# Patient Record
Sex: Female | Born: 1980 | Race: White | Hispanic: No | Marital: Single | Smoking: Never smoker
Health system: Southern US, Community
[De-identification: ages and names within clinical notes are randomized; demographics above are authoritative.]

## PROBLEM LIST (undated history)

## (undated) DIAGNOSIS — N200 Calculus of kidney: Secondary | ICD-10-CM

## (undated) HISTORY — PX: WRIST SURGERY: SHX841

---

## 2000-01-29 ENCOUNTER — Emergency Department (HOSPITAL_COMMUNITY): Admission: EM | Admit: 2000-01-29 | Discharge: 2000-01-29 | Payer: Self-pay | Admitting: *Deleted

## 2000-01-29 ENCOUNTER — Encounter: Payer: Self-pay | Admitting: *Deleted

## 2001-12-25 ENCOUNTER — Encounter: Payer: Self-pay | Admitting: Urology

## 2001-12-25 ENCOUNTER — Ambulatory Visit (HOSPITAL_BASED_OUTPATIENT_CLINIC_OR_DEPARTMENT_OTHER): Admission: RE | Admit: 2001-12-25 | Discharge: 2001-12-25 | Payer: Self-pay | Admitting: Urology

## 2002-02-10 ENCOUNTER — Other Ambulatory Visit: Admission: RE | Admit: 2002-02-10 | Discharge: 2002-02-10 | Payer: Self-pay | Admitting: Obstetrics and Gynecology

## 2003-03-19 ENCOUNTER — Other Ambulatory Visit: Admission: RE | Admit: 2003-03-19 | Discharge: 2003-03-19 | Payer: Self-pay | Admitting: Obstetrics and Gynecology

## 2003-03-23 ENCOUNTER — Other Ambulatory Visit: Admission: RE | Admit: 2003-03-23 | Discharge: 2003-03-23 | Payer: Self-pay | Admitting: Obstetrics and Gynecology

## 2004-05-03 ENCOUNTER — Other Ambulatory Visit: Admission: RE | Admit: 2004-05-03 | Discharge: 2004-05-03 | Payer: Self-pay | Admitting: Obstetrics and Gynecology

## 2004-09-20 ENCOUNTER — Emergency Department (HOSPITAL_COMMUNITY): Admission: EM | Admit: 2004-09-20 | Discharge: 2004-09-21 | Payer: Self-pay | Admitting: Emergency Medicine

## 2004-10-09 ENCOUNTER — Ambulatory Visit (HOSPITAL_COMMUNITY): Admission: RE | Admit: 2004-10-09 | Discharge: 2004-10-10 | Payer: Self-pay | Admitting: Orthopedic Surgery

## 2005-06-03 ENCOUNTER — Other Ambulatory Visit: Admission: RE | Admit: 2005-06-03 | Discharge: 2005-06-03 | Payer: Self-pay | Admitting: Obstetrics and Gynecology

## 2005-12-04 ENCOUNTER — Other Ambulatory Visit: Admission: RE | Admit: 2005-12-04 | Discharge: 2005-12-04 | Payer: Self-pay | Admitting: Obstetrics and Gynecology

## 2013-03-21 ENCOUNTER — Emergency Department (HOSPITAL_COMMUNITY)
Admission: EM | Admit: 2013-03-21 | Discharge: 2013-03-21 | Disposition: A | Discharge status: Home / Self Care | Attending: Emergency Medicine | Admitting: Emergency Medicine

## 2013-03-21 ENCOUNTER — Emergency Department (HOSPITAL_COMMUNITY)

## 2013-03-21 ENCOUNTER — Encounter (HOSPITAL_COMMUNITY): Payer: Self-pay | Admitting: *Deleted

## 2013-03-21 DIAGNOSIS — S99919A Unspecified injury of unspecified ankle, initial encounter: Secondary | ICD-10-CM | POA: Insufficient documentation

## 2013-03-21 DIAGNOSIS — W108XXA Fall (on) (from) other stairs and steps, initial encounter: Secondary | ICD-10-CM | POA: Insufficient documentation

## 2013-03-21 DIAGNOSIS — Y9301 Activity, walking, marching and hiking: Secondary | ICD-10-CM | POA: Insufficient documentation

## 2013-03-21 DIAGNOSIS — S99921A Unspecified injury of right foot, initial encounter: Secondary | ICD-10-CM

## 2013-03-21 DIAGNOSIS — Z79899 Other long term (current) drug therapy: Secondary | ICD-10-CM | POA: Insufficient documentation

## 2013-03-21 DIAGNOSIS — S8990XA Unspecified injury of unspecified lower leg, initial encounter: Secondary | ICD-10-CM | POA: Insufficient documentation

## 2013-03-21 DIAGNOSIS — Y9289 Other specified places as the place of occurrence of the external cause: Secondary | ICD-10-CM | POA: Insufficient documentation

## 2013-03-21 DIAGNOSIS — Z87442 Personal history of urinary calculi: Secondary | ICD-10-CM | POA: Insufficient documentation

## 2013-03-21 HISTORY — DX: Calculus of kidney: N20.0

## 2013-03-21 MED ORDER — HYDROCODONE-ACETAMINOPHEN 5-325 MG PO TABS
1.0000 | ORAL_TABLET | Freq: Once | ORAL | Status: AC
  Administered 2013-03-21: 1 via ORAL
  Filled 2013-03-21: qty 1

## 2013-03-21 MED ORDER — HYDROCODONE-ACETAMINOPHEN 5-325 MG PO TABS: 1.0000 | ORAL_TABLET | ORAL | Status: DC | PRN

## 2013-03-21 NOTE — Progress Notes (Signed)
Orthopedic Tech Progress Note Patient Details:  Julia Rosales [birth year] [id number]  Ortho Devices Type of Ortho Device: Crutches;CAM walker Ortho Device/Splint Interventions: Application   Cammer, Mickie Bail 03/21/2013, 12:38 PM

## 2013-03-21 NOTE — ED Notes (Signed)
Pt dc'd home w/all belongings, alert and ambulatory w/crutches upon dc, pt gave a return demonstration with crutches, pt verbalizes understanding of dc instructions, 1 new rx prescribed

## 2013-03-21 NOTE — ED Notes (Signed)
Ortho tech called for assistance with crutches and cam walker

## 2013-03-21 NOTE — ED Provider Notes (Signed)
History     CSN: [id number]  Arrival date & time 03/21/13  1033   First MD Initiated Contact with Patient 03/21/13 1037      Chief Complaint  Patient presents with  . Foot Injury    (Consider location/radiation/quality/duration/timing/severity/associated sxs/prior treatment) Patient is a 32 y.o. female presenting with foot injury. The history is provided by the patient.  Foot Injury Location:  Foot Foot location:  R foot Pain details:    Severity:  Moderate Associated symptoms: no fever and no neck pain   Associated symptoms comment:  She fell last night while walking down stairs. Injury to right foot with persistent swelling and discoloration today. Painful ambulation. NO other injury.   Past Medical History  Diagnosis Date  . Kidney stones     Past Surgical History  Procedure Laterality Date  . Wrist surgery      History reviewed. No pertinent family history.  History  Substance Use Topics  . Smoking status: Not on file  . Smokeless tobacco: Not on file  . Alcohol Use: Not on file    OB History   Grav Para Term Preterm Abortions TAB SAB Ect Mult Living                  Review of Systems  Constitutional: Negative for fever and chills.  HENT: Negative for neck pain.   Gastrointestinal: Negative.  Negative for abdominal pain.  Musculoskeletal:       See HPI.  Skin: Negative.   Neurological: Negative.  Negative for numbness.    Allergies  Review of patient's allergies indicates no known allergies.  Home Medications   Current Outpatient Rx  Name  Route  Sig  Dispense  Refill  . BIOTIN PO   Oral   Take 1 tablet by mouth daily.         Marland Kitchen ibuprofen (ADVIL,MOTRIN) 200 MG tablet   Oral   Take 600 mg by mouth every 6 (six) hours as needed for pain.         Marland Kitchen levonorgestrel-ethinyl estradiol (JOLESSA) 0.15-0.03 MG tablet   Oral   Take 1 tablet by mouth daily.         . Multiple Vitamin (MULTIVITAMIN) tablet   Oral   Take 1 tablet by mouth  daily.         Marland Kitchen oxyCODONE-acetaminophen (PERCOCET) 10-325 MG per tablet   Oral   Take 1 tablet by mouth every 4 (four) hours as needed for pain.           BP 127/77  Pulse 78  Temp(Src) 98.9 F (37.2 C) (Oral)  Resp 16  SpO2 100%  Physical Exam  Constitutional: She is oriented to person, place, and time. She appears well-developed and well-nourished.  Neck: Normal range of motion.  Cardiovascular: Intact distal pulses.   Pulmonary/Chest: Effort normal.  Musculoskeletal:  Right foot with marked swelling to dorsal, anterolateral mid-foot. Moderate ecchymosis. Ankle is non-tender to palpation and joint is stable. FROM all digits.   Neurological: She is alert and oriented to person, place, and time.  Skin: Skin is warm and dry.    ED Course  Procedures (including critical care time)  Labs Reviewed - No data to display Dg Foot Complete Right  03/21/2013  *RADIOLOGY REPORT*  Clinical Data: Fall down stairs, metatarsal pain  RIGHT FOOT COMPLETE - 3+ VIEW  Comparison: None.  Findings: On the oblique view, there is a nondisplaced fracture of the anterior calcaneus along the articular surface.  Also, on the lateral view, there is an acute minimally-displaced fracture of the navicular bone dorsally.  Mild soft tissue swelling in this region. No significant malalignment of the hind foot.  No other fractures demonstrated by plain radiography.  IMPRESSION: Small acute avulsion type fractures of the anterior calcaneus articular surface and the dorsal navicular bone.   Original Report Authenticated By: Judie Petit. Miles Costain, M.D.      No diagnosis found.  1. Right foot injury.  MDM  Avulsion fractures to calcaneus and navicular bones. CAM walker applied with crutches. Ortho follow up.        Arnoldo Hooker, PA-C 03/21/13 1254

## 2013-03-21 NOTE — ED Provider Notes (Signed)
Medical screening examination/treatment/procedure(s) were performed by non-physician practitioner and as supervising physician I was immediately available for consultation/collaboration.   Cythina Mickelsen B. Bernette Mayers, MD 03/21/13 1258

## 2013-03-21 NOTE — ED Notes (Signed)
To ED for eval of right foot injury and deformity since falling down a cple stairs last pm. Good cms

## 2013-12-07 IMAGING — CR DG FOOT COMPLETE 3+V*R*
3 series · 3 of 3 positions shown · non-contrast
Comparison: None.

CLINICAL DATA: Fall down stairs, metatarsal pain

RIGHT FOOT COMPLETE - 3+ VIEW

[t foot ap right]
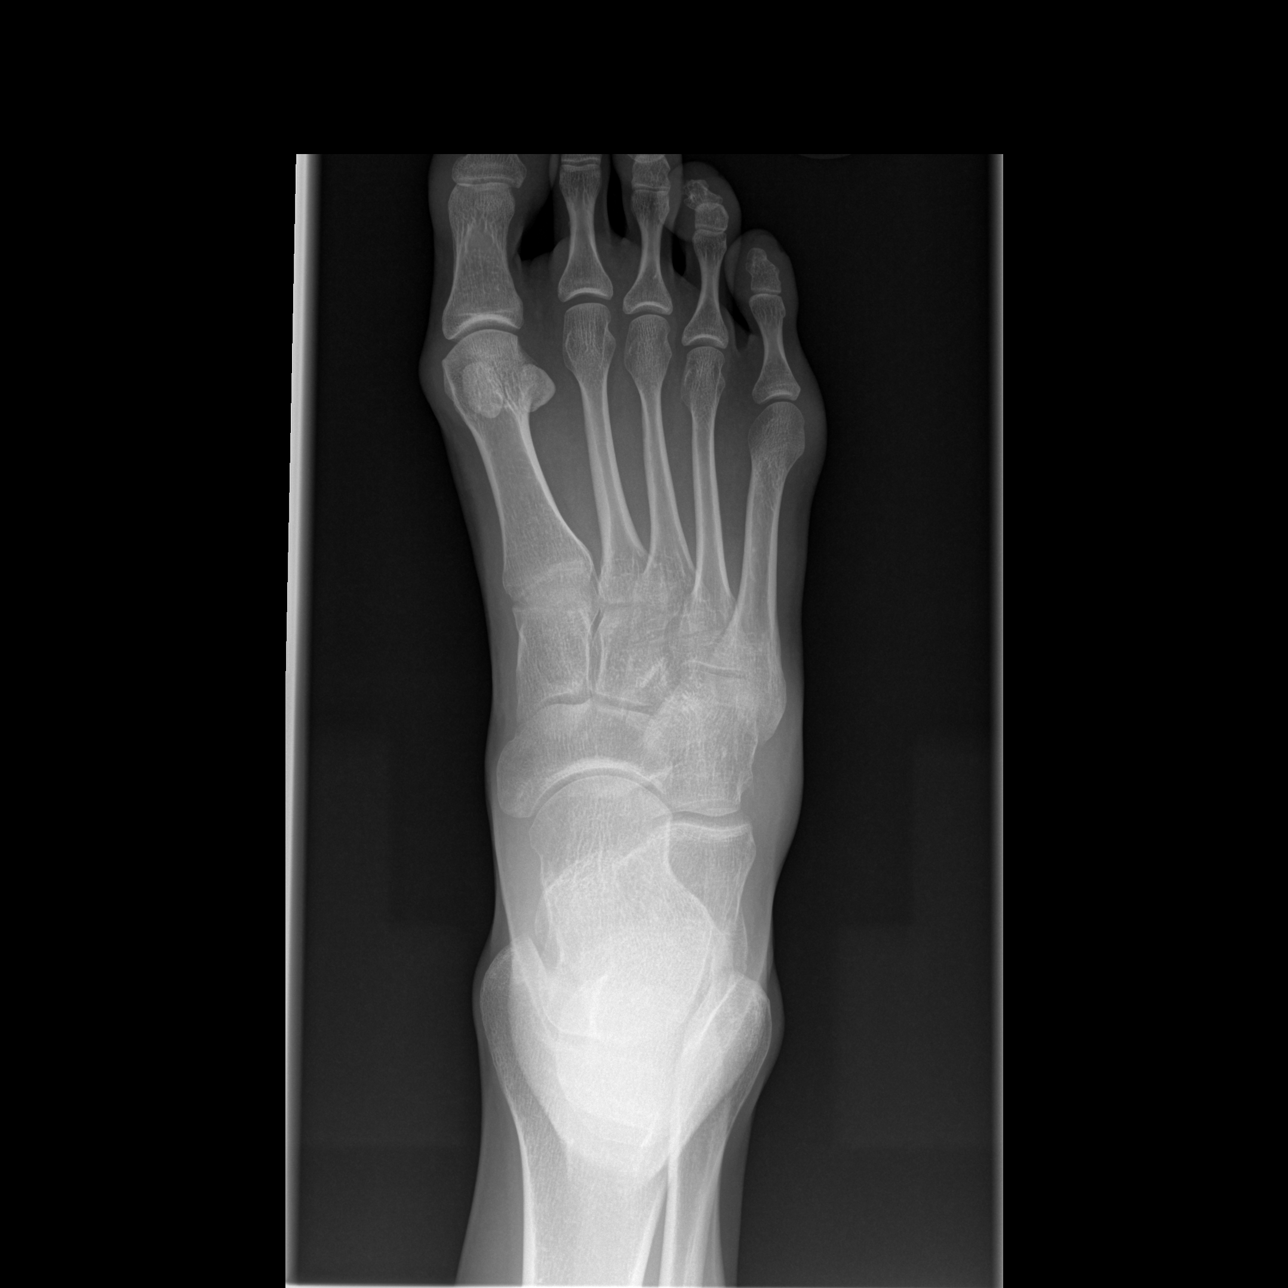

[t foot oblique right]
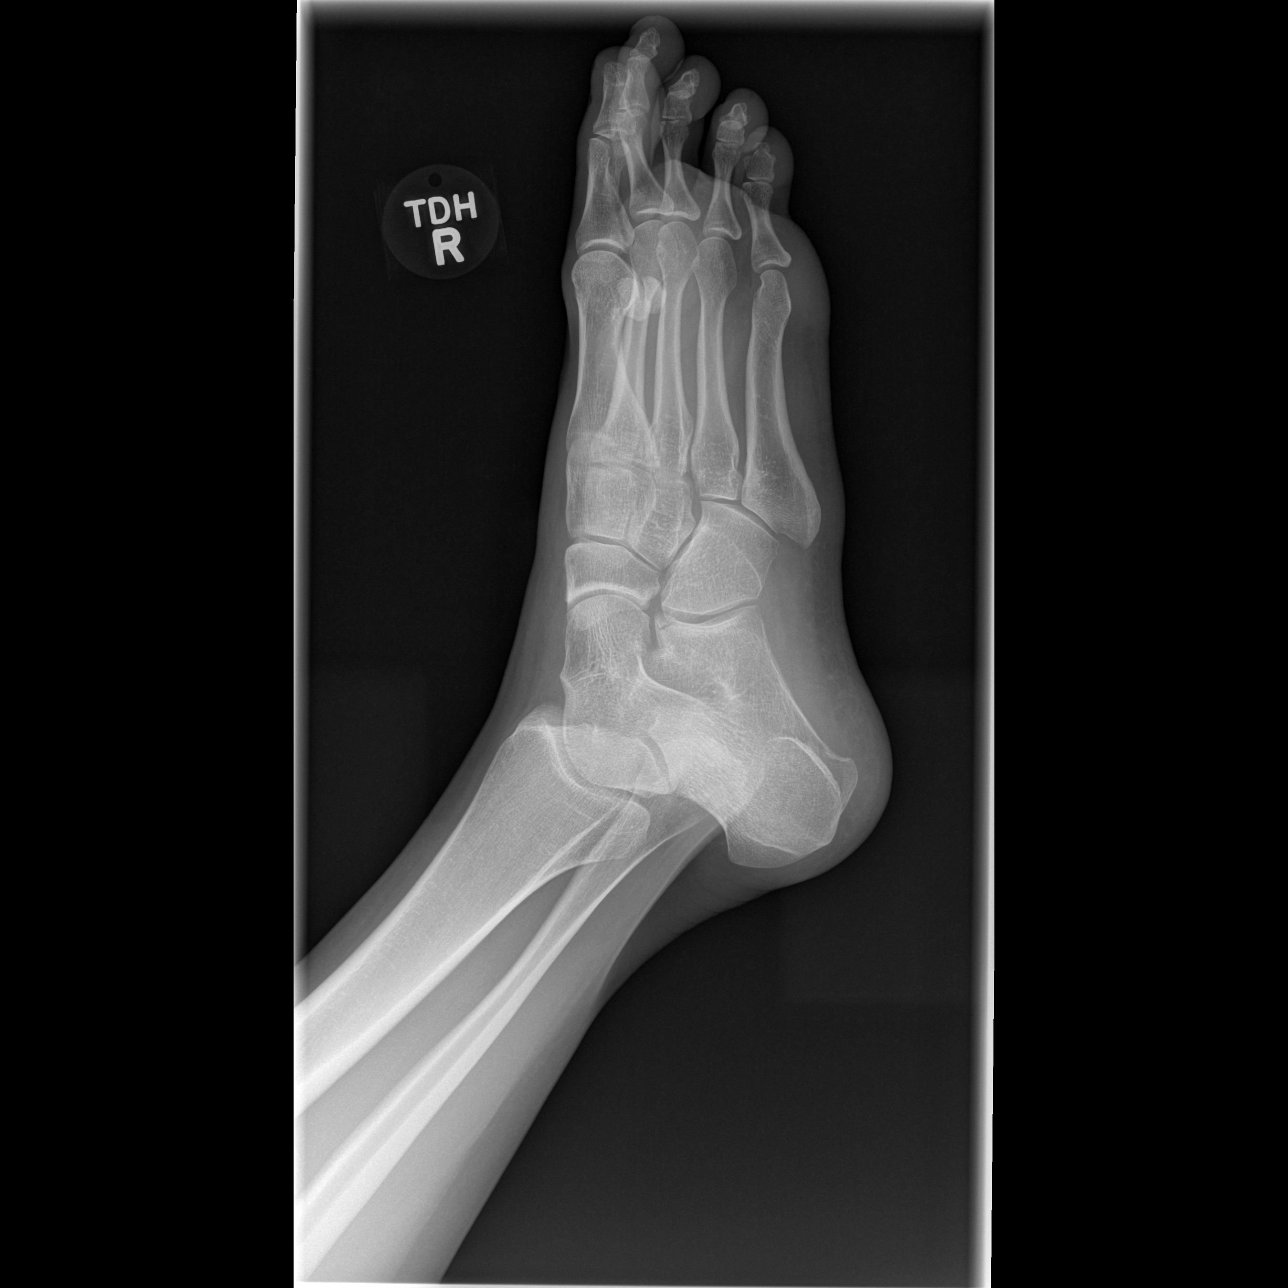

[t foot lat right]
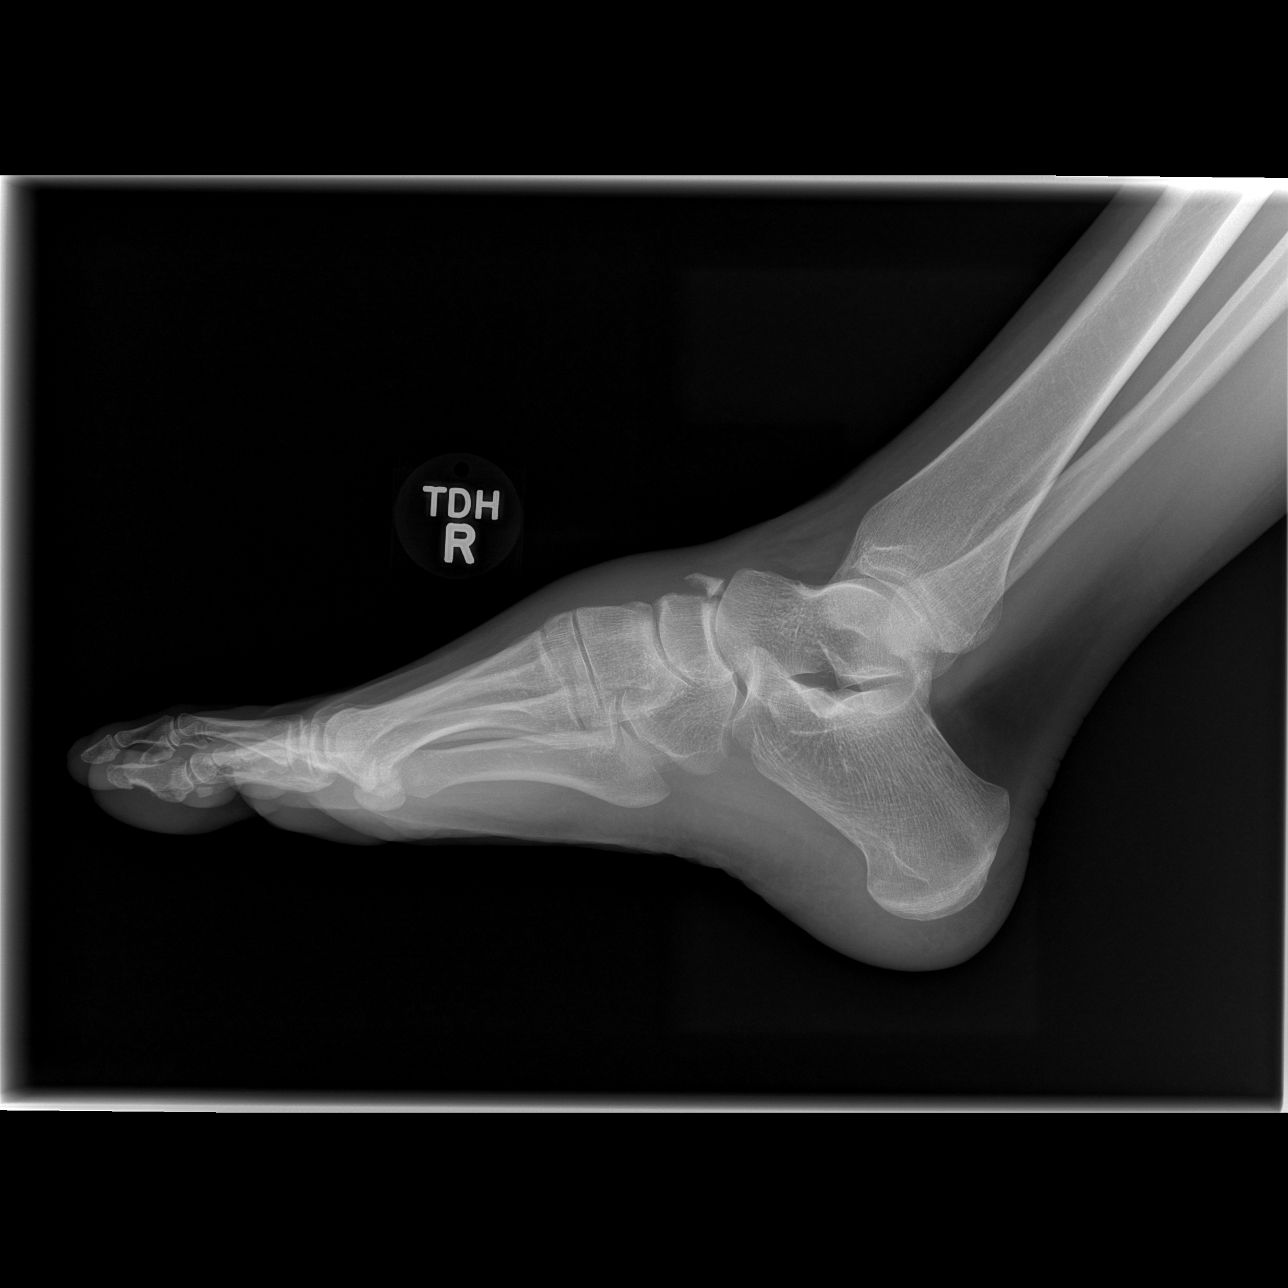

[3 of 3 positions shown; findings below may reference images not displayed]

FINDINGS: On the oblique view, there is a nondisplaced fracture of
the anterior calcaneus along the articular surface.  Also, on the
lateral view, there is an acute minimally-displaced fracture of the
navicular bone dorsally.  Mild soft tissue swelling in this region.
No significant malalignment of the hind foot.  No other fractures
demonstrated by plain radiography.
IMPRESSION: Small acute avulsion type fractures of the anterior calcaneus
articular surface and the dorsal navicular bone.

## 2014-04-01 ENCOUNTER — Emergency Department (HOSPITAL_COMMUNITY): Payer: No Typology Code available for payment source

## 2014-04-01 ENCOUNTER — Encounter (HOSPITAL_COMMUNITY): Payer: Self-pay | Admitting: Emergency Medicine

## 2014-04-01 ENCOUNTER — Emergency Department (HOSPITAL_COMMUNITY)
Admission: EM | Admit: 2014-04-01 | Discharge: 2014-04-01 | Disposition: A | Discharge status: Home / Self Care | Payer: No Typology Code available for payment source | Attending: Emergency Medicine | Admitting: Emergency Medicine

## 2014-04-01 DIAGNOSIS — Z79899 Other long term (current) drug therapy: Secondary | ICD-10-CM | POA: Diagnosis not present

## 2014-04-01 DIAGNOSIS — F17200 Nicotine dependence, unspecified, uncomplicated: Secondary | ICD-10-CM | POA: Diagnosis not present

## 2014-04-01 DIAGNOSIS — Y92410 Unspecified street and highway as the place of occurrence of the external cause: Secondary | ICD-10-CM | POA: Diagnosis not present

## 2014-04-01 DIAGNOSIS — S0993XA Unspecified injury of face, initial encounter: Secondary | ICD-10-CM | POA: Diagnosis present

## 2014-04-01 DIAGNOSIS — Y9389 Activity, other specified: Secondary | ICD-10-CM | POA: Diagnosis not present

## 2014-04-01 DIAGNOSIS — Z87442 Personal history of urinary calculi: Secondary | ICD-10-CM | POA: Insufficient documentation

## 2014-04-01 DIAGNOSIS — S3981XA Other specified injuries of abdomen, initial encounter: Secondary | ICD-10-CM | POA: Diagnosis not present

## 2014-04-01 DIAGNOSIS — S139XXA Sprain of joints and ligaments of unspecified parts of neck, initial encounter: Secondary | ICD-10-CM | POA: Insufficient documentation

## 2014-04-01 DIAGNOSIS — S20219A Contusion of unspecified front wall of thorax, initial encounter: Secondary | ICD-10-CM | POA: Insufficient documentation

## 2014-04-01 DIAGNOSIS — S79929A Unspecified injury of unspecified thigh, initial encounter: Secondary | ICD-10-CM

## 2014-04-01 DIAGNOSIS — S161XXA Strain of muscle, fascia and tendon at neck level, initial encounter: Secondary | ICD-10-CM

## 2014-04-01 DIAGNOSIS — S79919A Unspecified injury of unspecified hip, initial encounter: Secondary | ICD-10-CM | POA: Diagnosis not present

## 2014-04-01 MED ORDER — OXYCODONE-ACETAMINOPHEN 5-325 MG PO TABS
1.0000 | ORAL_TABLET | Freq: Once | ORAL | Status: AC
  Administered 2014-04-01: 1 via ORAL
  Filled 2014-04-01: qty 1

## 2014-04-01 MED ORDER — IBUPROFEN 600 MG PO TABS: 600.0000 mg | ORAL_TABLET | Freq: Four times a day (QID) | ORAL | Status: DC | PRN

## 2014-04-01 MED ORDER — OXYCODONE-ACETAMINOPHEN 5-325 MG PO TABS: 1.0000 | ORAL_TABLET | Freq: Four times a day (QID) | ORAL | Status: DC | PRN

## 2014-04-01 MED ORDER — IBUPROFEN 200 MG PO TABS
600.0000 mg | ORAL_TABLET | Freq: Once | ORAL | Status: AC
  Administered 2014-04-01: 600 mg via ORAL
  Filled 2014-04-01: qty 3

## 2014-04-01 NOTE — ED Provider Notes (Signed)
Medical screening examination/treatment/procedure(s) were performed by non-physician practitioner and as supervising physician I was immediately available for consultation/collaboration.   EKG Interpretation None       Sunnie NielsenBrian Socorro Ebron, MD 04/01/14 2314
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
## 2014-04-01 NOTE — Discharge Instructions (Signed)
Cervical Sprain A cervical sprain is when the tissues (ligaments) that hold the neck bones in place stretch or tear. HOME CARE   Put ice on the injured area.  Put ice in a plastic bag.  Place a towel between your skin and the bag.  Leave the ice on for 15 20 minutes, 3 4 times a day.  You may have been given a collar to wear. This collar keeps your neck from moving while you heal.  Do not take the collar off unless told by your doctor.  If you have long hair, keep it outside of the collar.  Ask your doctor before changing the position of your collar. You may need to change its position over time to make it more comfortable.  If you are allowed to take off the collar for cleaning or bathing, follow your doctor's instructions on how to do it safely.  Keep your collar clean by wiping it with mild soap and water. Dry it completely. If the collar has removable pads, remove them every 1 2 days to hand wash them with soap and water. Allow them to air dry. They should be dry before you wear them in the collar.  Do not drive while wearing the collar.  Only take medicine as told by your doctor.  Keep all doctor visits as told.  Keep all physical therapy visits as told.  Adjust your work station so that you have good posture while you work.  Avoid positions and activities that make your problems worse.  Warm up and stretch before being active. GET HELP IF:  Your pain is not controlled with medicine.  You cannot take less pain medicine over time as planned.  Your activity level does not improve as expected. GET HELP RIGHT AWAY IF:   You are bleeding.  Your stomach is upset.  You have an allergic reaction to your medicine.  You develop new problems that you cannot explain.  You lose feeling (become numb) or you cannot move any part of your body (paralysis).  You have tingling or weakness in any part of your body.  Your symptoms get worse. Symptoms include:  Pain,  soreness, stiffness, puffiness (swelling), or a burning feeling in your neck.  Pain when your neck is touched.  Shoulder or upper back pain.  Limited ability to move your neck.  Headache.  Dizziness.  Your hands or arms feel week, lose feeling, or tingle.  Muscle spasms.  Difficulty swallowing or chewing. MAKE SURE YOU:   Understand these instructions.  Will watch your condition.  Will get help right away if you are not doing well or get worse. Document Released: 04/22/2008 Document Revised: 07/07/2013 Document Reviewed: 05/12/2013 [facility] Patient Information 2014 [employer] [place]. Your xray is normal Please take the Ibuprofen on a regular basis , use the narcotic for severe pain If youdevelope new or concerning symptoms please return for further evaluation

## 2014-04-01 NOTE — ED Notes (Signed)
Patient transported to X-ray 
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
## 2016-12-30 ENCOUNTER — Other Ambulatory Visit: Payer: Self-pay | Admitting: Obstetrics and Gynecology

## 2016-12-30 DIAGNOSIS — N6310 Unspecified lump in the right breast, unspecified quadrant: Secondary | ICD-10-CM

## 2017-01-03 ENCOUNTER — Other Ambulatory Visit

## 2017-01-03 ENCOUNTER — Encounter

## 2017-01-09 ENCOUNTER — Encounter

## 2017-01-09 ENCOUNTER — Other Ambulatory Visit

## 2017-03-28 ENCOUNTER — Other Ambulatory Visit: Payer: Self-pay | Admitting: Urology

## 2018-01-29 DIAGNOSIS — F419 Anxiety disorder, unspecified: Secondary | ICD-10-CM | POA: Insufficient documentation

## 2018-07-14 DIAGNOSIS — Z7721 Contact with and (suspected) exposure to potentially hazardous body fluids: Secondary | ICD-10-CM | POA: Insufficient documentation

## 2018-12-16 ENCOUNTER — Encounter: Payer: Self-pay | Admitting: Podiatry

## 2018-12-16 ENCOUNTER — Ambulatory Visit (INDEPENDENT_AMBULATORY_CARE_PROVIDER_SITE_OTHER): Payer: BLUE CROSS/BLUE SHIELD

## 2018-12-16 ENCOUNTER — Ambulatory Visit: Payer: BLUE CROSS/BLUE SHIELD | Admitting: Podiatry

## 2018-12-16 ENCOUNTER — Other Ambulatory Visit: Payer: Self-pay | Admitting: Podiatry

## 2018-12-16 VITALS — BP 131/75 | HR 71

## 2018-12-16 DIAGNOSIS — M79671 Pain in right foot: Secondary | ICD-10-CM | POA: Diagnosis not present

## 2018-12-16 DIAGNOSIS — M84376A Stress fracture, unspecified foot, initial encounter for fracture: Secondary | ICD-10-CM

## 2018-12-16 DIAGNOSIS — M84374A Stress fracture, right foot, initial encounter for fracture: Secondary | ICD-10-CM

## 2018-12-16 MED ORDER — TRAMADOL HCL 50 MG PO TABS
50.0000 mg | ORAL_TABLET | Freq: Three times a day (TID) | ORAL | 2 refills | Status: AC
Start: 2018-12-16 — End: ?
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
## 2018-12-24 DIAGNOSIS — M84374A Stress fracture, right foot, initial encounter for fracture: Secondary | ICD-10-CM | POA: Insufficient documentation

## 2018-12-24 DIAGNOSIS — R233 Spontaneous ecchymoses: Secondary | ICD-10-CM | POA: Insufficient documentation

## 2018-12-24 DIAGNOSIS — R21 Rash and other nonspecific skin eruption: Secondary | ICD-10-CM | POA: Insufficient documentation

## 2018-12-24 DIAGNOSIS — R238 Other skin changes: Secondary | ICD-10-CM | POA: Insufficient documentation

## 2018-12-24 DIAGNOSIS — E049 Nontoxic goiter, unspecified: Secondary | ICD-10-CM | POA: Insufficient documentation

## 2018-12-24 DIAGNOSIS — Z7689 Persons encountering health services in other specified circumstances: Secondary | ICD-10-CM | POA: Insufficient documentation

## 2018-12-24 DIAGNOSIS — Z8349 Family history of other endocrine, nutritional and metabolic diseases: Secondary | ICD-10-CM | POA: Insufficient documentation

## 2019-01-06 ENCOUNTER — Other Ambulatory Visit: Payer: Self-pay | Admitting: Podiatry

## 2019-01-06 ENCOUNTER — Encounter: Payer: Self-pay | Admitting: Podiatry

## 2019-01-06 ENCOUNTER — Ambulatory Visit: Payer: BLUE CROSS/BLUE SHIELD | Admitting: Podiatry

## 2019-01-06 ENCOUNTER — Ambulatory Visit (INDEPENDENT_AMBULATORY_CARE_PROVIDER_SITE_OTHER): Payer: BLUE CROSS/BLUE SHIELD

## 2019-01-06 DIAGNOSIS — S9031XD Contusion of right foot, subsequent encounter: Secondary | ICD-10-CM

## 2019-01-06 DIAGNOSIS — M84376G Stress fracture, unspecified foot, subsequent encounter for fracture with delayed healing: Secondary | ICD-10-CM

## 2019-01-06 DIAGNOSIS — M84376A Stress fracture, unspecified foot, initial encounter for fracture: Secondary | ICD-10-CM | POA: Diagnosis not present

## 2019-01-11 NOTE — Progress Notes (Signed)
Subjective:   Patient ID: Julia Rosales, female   DOB: 38 y.o.   MRN: [id number]   HPI Patient presents stating that her foot overall is feeling pretty good but it still achy and swollen   ROS      Objective:  Physical Exam  Neurovascular status intact with patient's right dorsal foot continuing to be painful in the midshaft third with localized pain with palpation and states is been gradually becoming less aggravating but still sore     Assessment:  Stress fracture right third metatarsal which most likely is gradually healing     Plan:  H&P condition reviewed x-ray reviewed and today aggressive ice continue boot usage recommended along with supportive shoe gear.  Patient will be seen back for [place] to recheck  X-ray indicates stress fracture of the third metatarsal midshaft localized with indications of primary healing occurring with secondary type process

## 2020-05-03 ENCOUNTER — Ambulatory Visit: Payer: Self-pay | Attending: Internal Medicine
